# Patient Record
Sex: Female | Born: 1965 | Hispanic: Yes | Marital: Single | Smoking: Never smoker
Health system: Southern US, Community
[De-identification: ages and names within clinical notes are randomized; demographics above are authoritative.]

---

## 2004-03-16 ENCOUNTER — Emergency Department: Payer: Self-pay | Admitting: Emergency Medicine

## 2004-11-04 ENCOUNTER — Emergency Department: Payer: Self-pay | Admitting: General Practice

## 2004-11-05 ENCOUNTER — Ambulatory Visit: Payer: Self-pay | Admitting: Emergency Medicine

## 2005-02-27 ENCOUNTER — Emergency Department: Payer: Self-pay | Admitting: Emergency Medicine

## 2011-04-03 ENCOUNTER — Ambulatory Visit: Payer: Self-pay

## 2014-09-21 ENCOUNTER — Other Ambulatory Visit: Payer: Self-pay | Admitting: Oncology

## 2014-09-21 ENCOUNTER — Ambulatory Visit: Payer: Self-pay | Attending: Oncology | Admitting: *Deleted

## 2014-09-21 ENCOUNTER — Encounter: Payer: Self-pay | Admitting: *Deleted

## 2014-09-21 ENCOUNTER — Ambulatory Visit
Admission: RE | Admit: 2014-09-21 | Discharge: 2014-09-21 | Disposition: A | Discharge status: Home / Self Care | Payer: Self-pay | Source: Ambulatory Visit | Attending: Oncology | Admitting: Oncology

## 2014-09-21 VITALS — BP 107/70 | HR 80 | Temp 97.9°F | Resp 16 | Ht 59.06 in | Wt 215.7 lb

## 2014-09-21 DIAGNOSIS — Z0000 Encounter for general adult medical examination without abnormal findings: Secondary | ICD-10-CM

## 2014-09-21 NOTE — Progress Notes (Signed)
Subjective:     Patient ID: Kathy Washington, female   DOB: September 24, 1965, 49 y.o.   MRN: [id number]  HPI   Review of Systems     Objective:   Physical Exam  Pulmonary/Chest: Right breast exhibits no inverted nipple, no mass, no nipple discharge, no skin change and no tenderness. Left breast exhibits no inverted nipple, no mass, no nipple discharge, no skin change and no tenderness. Breasts are symmetrical.  Genitourinary: Uterus is not deviated, not enlarged, not fixed and not tender. Cervix exhibits no discharge and no friability. Right adnexum displays no mass, no tenderness and no fullness. Left adnexum displays no mass, no tenderness and no fullness.         Assessment:     49 year old Hispanic female presents to [facility] for annual screening.  Maritza, the interpreter present during the interview and exam.  Clinical breast exam unremarkable.  Taught self breast awareness.  Specimen collected for pap smear. Patient has been screened for eligibility.  She does not have any insurance, Medicare or Medicaid.  She also meets financial eligibility.  Hand-out given on the Affordable Care Act.    Plan:  Screening mammogram ordered.  Follow-up per [facility] protocol.

## 2014-09-23 LAB — PAP LB AND HPV HIGH-RISK
HPV, HIGH-RISK: NEGATIVE
PAP Smear Comment: 0

## 2014-09-27 ENCOUNTER — Encounter: Payer: Self-pay | Admitting: *Deleted

## 2014-09-27 NOTE — Progress Notes (Signed)
Letter mailed to inform patient of her normal mammogram and pap smear.  Follow up with screening mammogram in one year and next pap in 5 years.  HSIS to Christy. 

## 2015-12-20 ENCOUNTER — Encounter (INDEPENDENT_AMBULATORY_CARE_PROVIDER_SITE_OTHER): Payer: Self-pay

## 2015-12-20 ENCOUNTER — Ambulatory Visit
Admission: RE | Admit: 2015-12-20 | Discharge: 2015-12-20 | Disposition: A | Discharge status: Home / Self Care | Payer: Self-pay | Source: Ambulatory Visit | Attending: Oncology | Admitting: Oncology

## 2015-12-20 ENCOUNTER — Ambulatory Visit: Payer: Self-pay | Attending: Oncology | Admitting: *Deleted

## 2015-12-20 ENCOUNTER — Encounter: Payer: Self-pay | Admitting: *Deleted

## 2015-12-20 VITALS — BP 100/61 | HR 67 | Temp 98.5°F

## 2015-12-20 DIAGNOSIS — Z0000 Encounter for general adult medical examination without abnormal findings: Secondary | ICD-10-CM

## 2015-12-20 NOTE — Patient Instructions (Signed)
PATIENT INSTRUCTIONS FIBROCYSTIC BREAST DISEASE  FOLLOW-UP:  Call your physician should you develop a new breast mass that is different, if one particular lump starts to enlarge, or if nipple discharge develops.   CAUSE:  Many women have some lumpiness within their breasts and these areas at times can become tender during certain times in your menstrual cycle.  These areas tend to feel like a firm rubber ball as compared to a cancer which will more commonly feel hard and almost rock-like.  Fibrocystic breast disease does not in and of itself increase your risk for breast cancer but you should be sure to examine yiour breasts at the same time of the month on a monthly basis.  If there are a lot of areas of lumpiness you should tape a piece of paper on the mirror with a diagram of your breasts, noting where the areas of lumpiness are and their relative size.  You can then refer to this diagram on a monthly basis to keep better track of any changes should they occur.  DIET:  You should try and avoid foods, or at least minimize foods, such as chocolate and caffeine which may cause the symptoms of tenderness to become worse.  ACTIVITY:  You may want to wear a bra that offers additional support, and/or consider a sports bra, especially during those times when your breasts are more tender.  MEDICATIONS:  Taking Vitamin E capsules twice a day along with Evening of Primrose Capsules three times a day, or as directed on the bottle, may help your symptoms.  These are both available over-the-counter and without a prescription. There is clinical evidence that these may help symptoms in some patients.   If your physician has prescribed medication for your fibrocystic breast disease, be sure to take it as instructed on the bottle and let him/her know if you have any side effects.  QUESTIONS:  Please feel free to call your physician  if you have any questions, and they will be glad to assist you.   Gave patient  hand-out, Women Staying Healthy, Active and Well from [facility], with education on breast health, pap smears, heart and colon health.   

## 2015-12-20 NOTE — Progress Notes (Signed)
Subjective:     Patient ID: Kathy Washington, female   DOB: [birth year], 50 y.o.   MRN: [id number]  HPI   Review of Systems     Objective:   Physical Exam  Pulmonary/Chest: Right breast exhibits tenderness. Right breast exhibits no inverted nipple, no mass, no nipple discharge and no skin change. Left breast exhibits tenderness. Left breast exhibits no inverted nipple, no mass, no nipple discharge and no skin change. Breasts are symmetrical.         Assessment:     50 [facility] Hispanic [facility]  She was also referred by [facility] for right breast pain.  Lloyda, the interpreter present during the interview and exam.  Patient states she has been having intermittent bilateral breast pain in the upper outer quadrants prior to her menstrual cycle for the past 2 years.  States greater pain on the right.  States the pain is only present when she touches it.  States she drinks about 4 cups of coffee, plus 2 or 3 soda's a day.  On clinical breast exam there is tender grainy fibroglandular like tissue in bilateral upper outer quadrants.  There is no dominant mass, skin changes, nipple discharge or lymphadenopathy.  Taught self breast awareness. Patient has been screened for eligibility.  She does not have any insurance, Medicare or Medicaid.  She also meets financial eligibility.  Hand-out given on the Affordable Care Act.    Plan:     Screening mammogram ordered.  She is to decrease caffeine intake, wear a good support bra, and try OTC vitamin E or Evening Primrose Oil if pain persist.

## 2015-12-21 ENCOUNTER — Encounter: Payer: Self-pay | Admitting: *Deleted

## 2015-12-21 NOTE — Progress Notes (Signed)
Letter mailed to inform patient of her normal mammogram.  She is to return in one year with annual screening.

## 2017-03-15 IMAGING — MG MM DIGITAL SCREENING BILATERAL
6 series · 6 of 6 positions shown · non-contrast
Comparison: Previous exam(s).

CLINICAL DATA: Screening.

EXAM:
DIGITAL SCREENING BILATERAL MAMMOGRAM WITH CAD

[R MLO (1 of 2)]
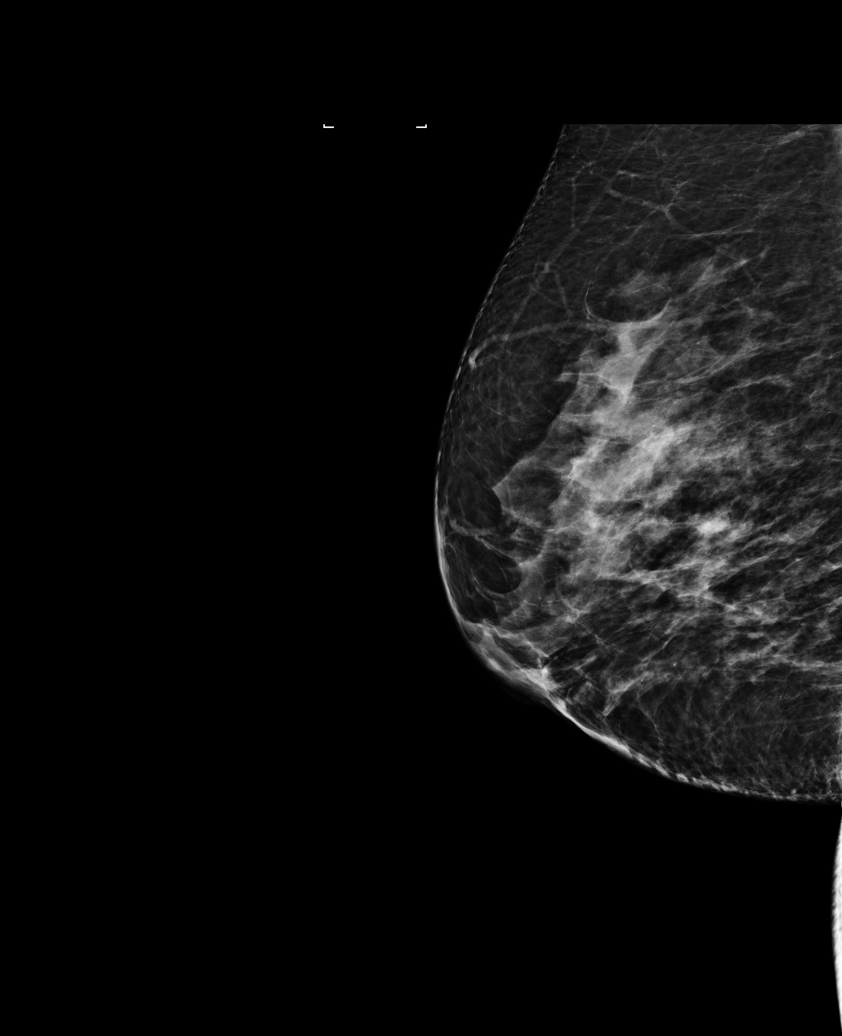

[L MLO (1 of 2)]
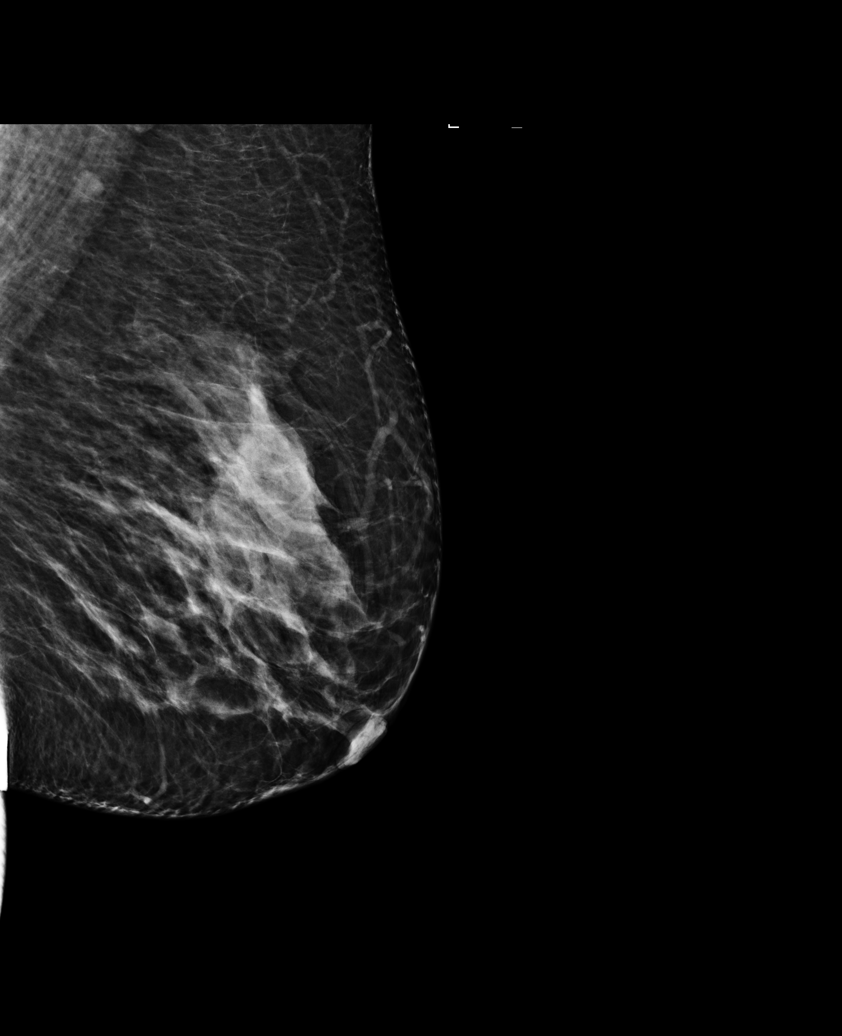

[R CC]
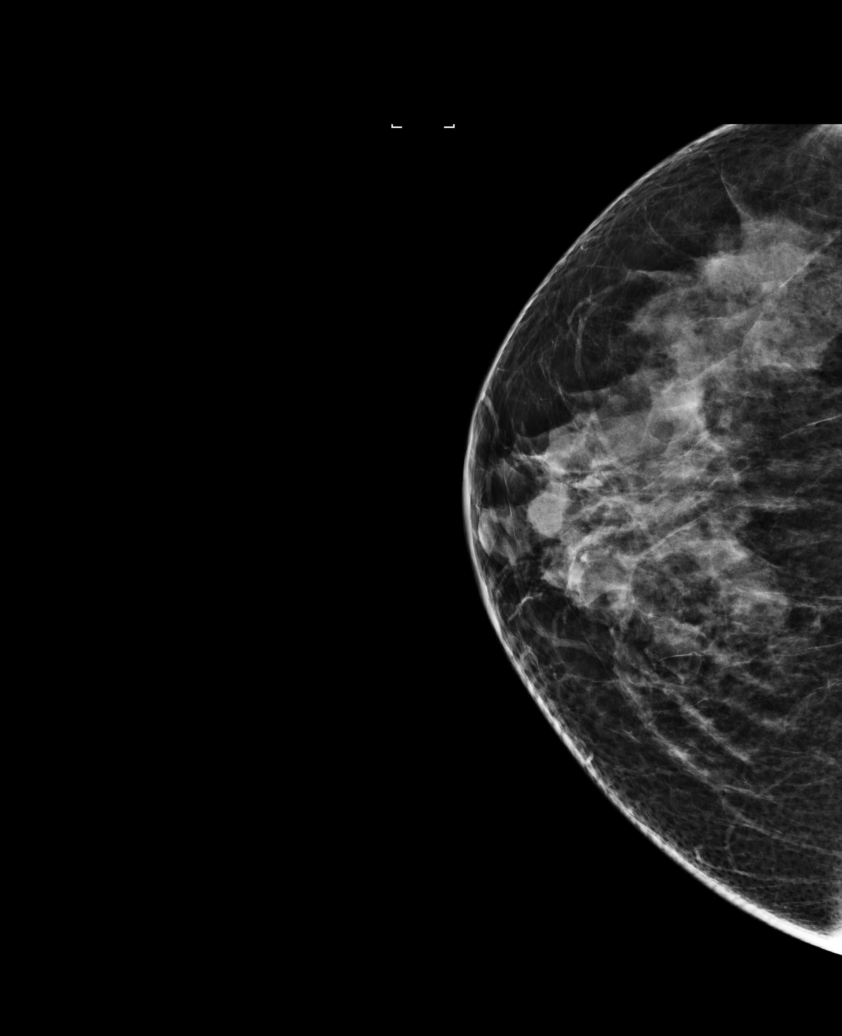

[L MLO (2 of 2)]
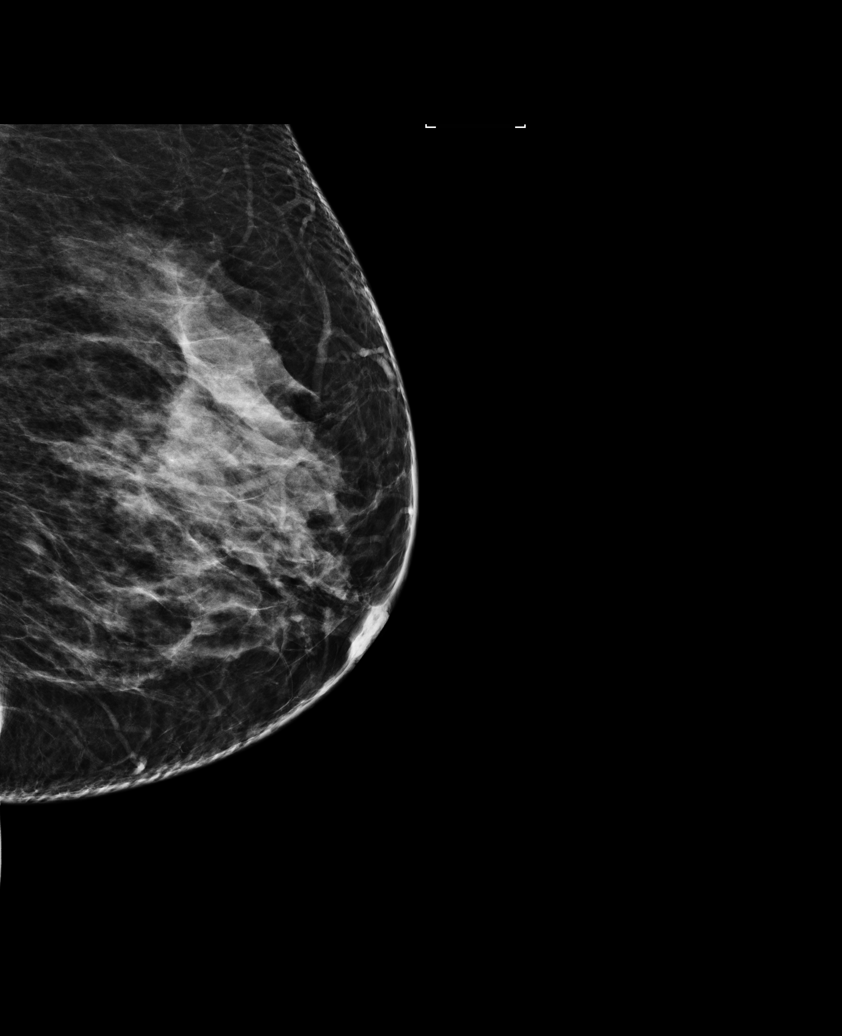

[L CC]
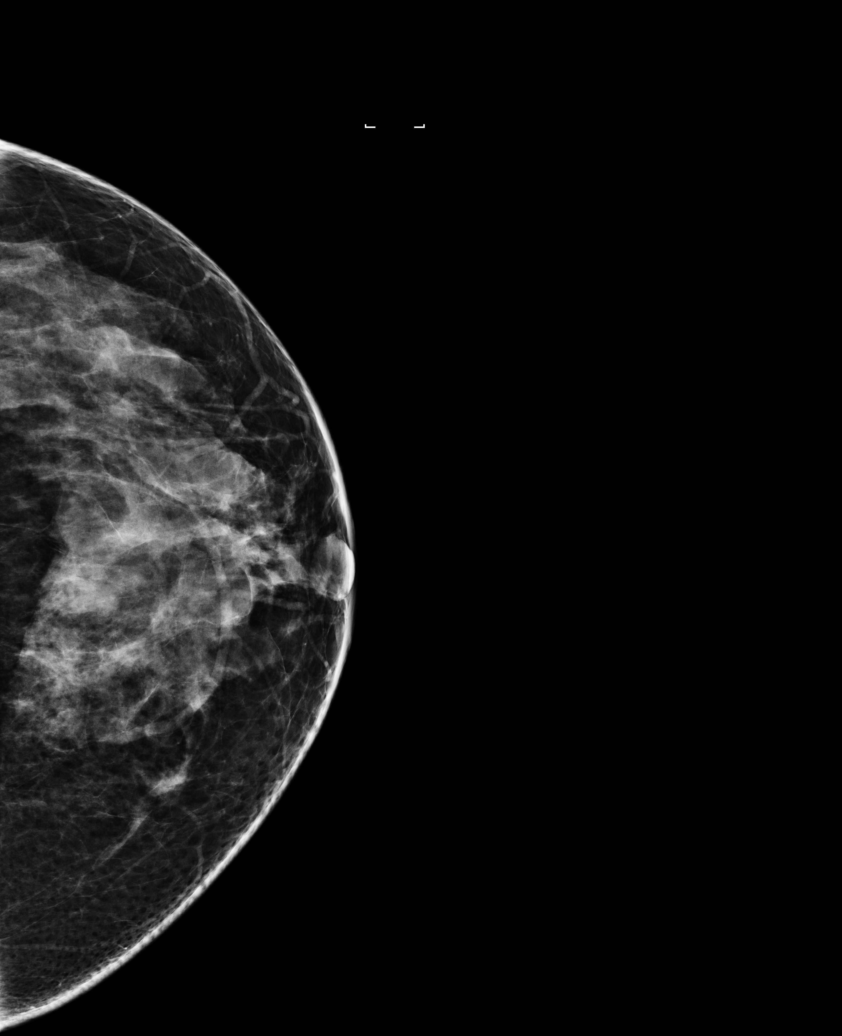

[R MLO (2 of 2)]
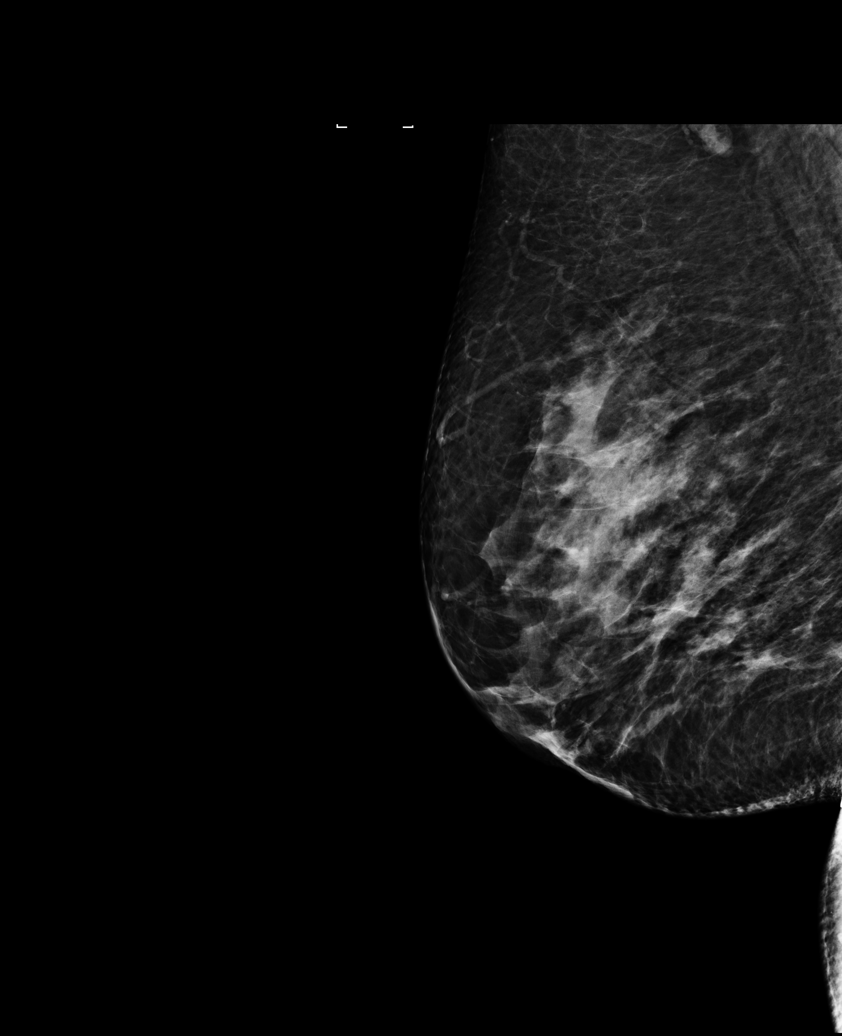

[6 of 6 positions shown; findings below may reference images not displayed]

ACR Breast Density Category d: The breast tissue is extremely dense,
which lowers the sensitivity of mammography.
FINDINGS: There are no findings suspicious for malignancy. Images were
processed with CAD.
IMPRESSION: No mammographic evidence of malignancy. A result letter of this
screening mammogram will be mailed directly to the patient.

RECOMMENDATION:
Screening mammogram in one year. (Code:[id number])

BI-RADS CATEGORY  1: Negative.

## 2018-01-21 ENCOUNTER — Ambulatory Visit: Payer: Self-pay | Attending: Oncology

## 2018-01-30 ENCOUNTER — Encounter: Payer: Self-pay | Admitting: Physician Assistant

## 2019-05-21 ENCOUNTER — Ambulatory Visit: Payer: Self-pay

## 2019-05-21 ENCOUNTER — Ambulatory Visit: Payer: Self-pay | Attending: Internal Medicine

## 2019-05-21 DIAGNOSIS — Z23 Encounter for immunization: Secondary | ICD-10-CM

## 2019-05-21 NOTE — Progress Notes (Signed)
   Covid-19 Vaccination Clinic  Name:  Kathy Washington    MRN: [id number] DOB: 18-Apr-1965  05/21/2019  Kathy Washington was observed post Covid-19 immunization for 15 minutes without incident. She was provided with Vaccine Information Sheet and instruction to access the V-Safe system.   Kathy Washington was instructed to call 911 with any severe reactions post vaccine: Marland Kitchen Difficulty breathing  . Swelling of face and throat  . A fast heartbeat  . A bad rash all over body  . Dizziness and weakness   Immunizations Administered    Name Date Dose VIS Date Route   Pfizer COVID-19 Vaccine 05/21/2019  5:44 PM 0.3 mL 02/05/2019 Intramuscular   Manufacturer: [employer], [employer]   Lot: [id number]   NDC: [address]-6

## 2019-06-11 ENCOUNTER — Ambulatory Visit: Payer: Self-pay

## 2019-06-12 ENCOUNTER — Ambulatory Visit: Payer: Self-pay | Attending: Internal Medicine

## 2019-06-12 DIAGNOSIS — Z23 Encounter for immunization: Secondary | ICD-10-CM

## 2019-06-12 NOTE — Progress Notes (Signed)
   Covid-19 Vaccination Clinic  Name:  Kathy Washington    MRN: [id number] DOB: August 06, 1965  06/12/2019  [name] was observed post Covid-19 immunization for 30 minutes based on pre-vaccination screening without incident. She was provided with Vaccine Information Sheet and instruction to access the V-Safe system.   [name] was instructed to call 911 with any severe reactions post vaccine: Marland Kitchen Difficulty breathing  . Swelling of face and throat  . A fast heartbeat  . A bad rash all over body  . Dizziness and weakness   Immunizations Administered    Name Date Dose VIS Date Route   Pfizer COVID-19 Vaccine 06/12/2019 10:36 AM 0.3 mL 02/05/2019 Intramuscular   Manufacturer: [employer], [employer]   Lot: [id number]   NDC: [address]-5

## 2019-12-31 DIAGNOSIS — D171 Benign lipomatous neoplasm of skin and subcutaneous tissue of trunk: Secondary | ICD-10-CM | POA: Insufficient documentation

## 2021-05-08 DIAGNOSIS — D509 Iron deficiency anemia, unspecified: Secondary | ICD-10-CM | POA: Insufficient documentation

## 2021-07-15 DIAGNOSIS — N939 Abnormal uterine and vaginal bleeding, unspecified: Secondary | ICD-10-CM | POA: Insufficient documentation

## 2023-03-27 ENCOUNTER — Encounter: Payer: Self-pay | Admitting: Family Medicine

## 2023-04-16 ENCOUNTER — Other Ambulatory Visit: Payer: Self-pay

## 2023-04-16 DIAGNOSIS — Z1231 Encounter for screening mammogram for malignant neoplasm of breast: Secondary | ICD-10-CM

## 2023-05-05 ENCOUNTER — Ambulatory Visit: Payer: Self-pay

## 2023-05-12 ENCOUNTER — Ambulatory Visit: Payer: Self-pay | Attending: Hematology and Oncology | Admitting: Hematology and Oncology

## 2023-05-12 ENCOUNTER — Ambulatory Visit
Admission: RE | Admit: 2023-05-12 | Discharge: 2023-05-12 | Disposition: A | Discharge status: Home / Self Care | Payer: Self-pay | Source: Ambulatory Visit | Attending: Obstetrics and Gynecology | Admitting: Obstetrics and Gynecology

## 2023-05-12 ENCOUNTER — Other Ambulatory Visit: Payer: Self-pay | Admitting: Hematology and Oncology

## 2023-05-12 VITALS — BP 121/77 | Wt 241.7 lb

## 2023-05-12 DIAGNOSIS — Z1231 Encounter for screening mammogram for malignant neoplasm of breast: Secondary | ICD-10-CM

## 2023-05-12 DIAGNOSIS — N6001 Solitary cyst of right breast: Secondary | ICD-10-CM

## 2023-05-12 DIAGNOSIS — Z803 Family history of malignant neoplasm of breast: Secondary | ICD-10-CM

## 2023-05-12 NOTE — Patient Instructions (Signed)
 Taught Kathy Washington about self breast awareness and gave educational materials to take home. Patient did not need a Pap smear today due to last Pap smear was in 09/14/2021 per patient. Let her know [facility] will cover Pap smears every 5 years unless has a history of abnormal Pap smears. Referred patient to the [facility] for screening mammogram. Appointment scheduled for 05/12/2023. Patient aware of appointment and will be there. Let patient know will follow up with her within the next couple weeks with results. Kathy Washington verbalized understanding.  Pascal Lux, NP 1:38 PM

## 2023-05-12 NOTE — Progress Notes (Unsigned)
 [name] is a 58 y.o. female who presents to [facility] clinic today with no complaints.    Pap Smear: Pap not smear completed today. Last Pap smear was 09/14/2021 at [facility] clinic and was normal. Per patient has no history of an abnormal Pap smear. Last Pap smear result is available in Epic.   Physical exam: Breasts Breasts symmetrical. No skin abnormalities bilateral breasts. No nipple retraction bilateral breasts. No nipple discharge bilateral breasts. No lymphadenopathy. No lumps palpated bilateral breasts.       Pelvic/Bimanual Pap is not indicated today    Smoking History: Patient has never smoked and was not referred to quit line.    Patient Navigation: Patient education provided. Access to services provided for patient through [facility] program. Kathy Washington interpreter provided. No transportation provided   Colorectal Cancer Screening: Per patient has had colonoscopy completed on 01/16/2022 with benign results.   No complaints today.    Breast and Cervical Cancer Risk Assessment: Patient has family history of breast cancer, with her twin sister who passed away from the disease. Patient does not have history of cervical dysplasia, immunocompromised, or DES exposure in-utero.  Risk Scores as of Encounter on 05/12/2023     Kathy Washington           5-year 2.26%   Lifetime 15.13%   This patient is Hispana/Latina but has no documented birth country, so the [place] model used data from [place] patients to calculate their risk score. Document a birth country in the Demographics activity for a more accurate score.         Last calculated by Narda Rutherford, LPN on 1/61/0960 at  1:20 PM      ;   A: [facility] exam without pap smear No complaints with benign exam.   P: Referred patient to the [facility] for a screening mammogram. Appointment scheduled 05/12/2023.  Ilda Basset A, NP 05/12/2023 1:16 PM

## 2023-07-09 ENCOUNTER — Encounter: Payer: Self-pay | Admitting: Licensed Clinical Social Worker

## 2023-07-09 ENCOUNTER — Inpatient Hospital Stay: Payer: Self-pay | Attending: Hematology and Oncology | Admitting: Licensed Clinical Social Worker

## 2023-07-09 ENCOUNTER — Inpatient Hospital Stay: Payer: Self-pay

## 2023-07-09 ENCOUNTER — Other Ambulatory Visit: Payer: Self-pay | Admitting: Licensed Clinical Social Worker

## 2023-07-09 DIAGNOSIS — Z800 Family history of malignant neoplasm of digestive organs: Secondary | ICD-10-CM

## 2023-07-09 DIAGNOSIS — Z803 Family history of malignant neoplasm of breast: Secondary | ICD-10-CM

## 2023-07-09 LAB — GENETIC SCREENING ORDER

## 2023-07-09 NOTE — Progress Notes (Signed)
 REFERRING PROVIDER: Adelaide Adjutant, NP [address] [place],  [place] [address]  PRIMARY PROVIDER:  Dionicia Frater, MD  PRIMARY REASON FOR VISIT:  1. Family history of breast cancer      HISTORY OF PRESENT ILLNESS:   [name], a 58 y.o. female, was seen for a [place] cancer genetics consultation at the request of Baldomero Bone, NP due to a family history of breast cancer.  [name] presents to clinic today to discuss the possibility of a hereditary predisposition to cancer, genetic testing, and to further clarify her future cancer risks, as well as potential cancer risks for family members.   CANCER HISTORY:  [name] is a 58 y.o. female with no personal history of cancer.    RISK FACTORS:  Menarche was at age 56.  First live birth at age 58.  Ovaries intact: yes.  Hysterectomy: no.  Colonoscopy: yes; normal. Mammogram within the last year: yes. Up to date with pelvic exams: yes.   FAMILY HISTORY:  We obtained a detailed, 4-generation family history.  Significant diagnoses are listed below: Family History  Problem Relation Age of Onset   Leukemia Mother 23   Lung cancer Father 3   Breast cancer Sister 75   Cancer Paternal Grandmother        unknown   Stomach cancer Paternal Grandfather        d.>50   Leukemia Son 47     [name] has 4 sons and 1 daughter, ages 55-38. Her oldest son was diagnosed with leukemia and lives in [place]. Patient had 1 twin sister, 3 other sisters and 1 brother. Her twin sister was diagnosed with breast cancer at 67 and passed at 42, she did not have genetic testing. Patient's nephew's son had unknown type of cancer and passed at 35. A niece's daughter passed of thyroid cancer at 88.  Ms. Hendriks mother died at 37 of leukemia. No other known cancers on this side of the family.  [name]'s father died at 58 of lung cancer. Patient's paternal grandfather had stomach  cancer and died over 67. Grandmother had metastatic cancer, unknown type and died over 36.   [name] is unaware of previous family history of genetic testing for hereditary cancer risks. There is no reported Ashkenazi Jewish ancestry. There is no known consanguinity.    GENETIC COUNSELING ASSESSMENT: [name] is a 58 y.o. female with a family history of breast cancer which is somewhat suggestive of a hereditary cancer syndrome and predisposition to cancer. We, therefore, discussed and recommended the following at today's visit.   DISCUSSION: We discussed that approximately 10% of breast cancer is hereditary. Most cases of hereditary breast cancer are associated with BRCA1/2 genes, although there are other genes associated with hereditary cancer as well. Cancers and risks are gene specific. We discussed that testing is beneficial for several reasons including knowing about cancer risks, identifying potential screening and risk-reduction options that may be appropriate, and to understand if other family members could be at risk for cancer and allow them to undergo genetic testing.   We reviewed the characteristics, features and inheritance patterns of hereditary cancer syndromes. We also discussed genetic testing, including the appropriate family members to test, the process of testing, insurance coverage and turn-around-time for results. We discussed the implications of a negative, positive and/or variant of uncertain significant result. We recommended [name] pursue genetic testing for the [facility] CancerNext+RNA gene panel.   The [facility] CancerNext+RNAinsight Panel includes  sequencing, rearrangement analysis, and RNA analysis for the following 40 genes: APC, ATM, BAP1, BARD1, BMPR1A, BRCA1, BRCA2, BRIP1, CDH1, CDKN2A, CHEK2, FH, FLCN, MET, MLH1, MSH2, MSH6, MUTYH, NF1, NTHL1, PALB2, PMS2, PTEN, RAD51C, RAD51D, RPS20, SMAD4, STK11, TP53, TSC1, TSC2, and VHL (sequencing and  deletion/duplication); AXIN2, HOXB13, MBD4, MSH3, POLD1 and POLE (sequencing only); EPCAM and GREM1 (deletion/duplication only).  Based on [name]'s family history of cancer, she meets medical criteria for genetic testing. Though [name] is not personally affected, there are no affected family members that are willing/able to undergo hereditary cancer testing.  Therefore, Ms. Mangieri the most informative family member available. Despite that she meets criteria, she may still have an out of pocket cost.   PLAN: After considering the risks, benefits, and limitations, [name] provided informed consent to pursue genetic testing and the blood sample was sent to [facility] for analysis of the CancerNext+RNA panel. Results should be available within approximately 2-3 weeks' time, at which point they will be disclosed by telephone to [name], as will any additional recommendations warranted by these results. [name] will receive a summary of her genetic counseling visit and a copy of her results once available. This information will also be available in Epic.   [name]'s questions were answered to her satisfaction today. Our contact information was provided should additional questions or concerns arise. Thank you for the referral and allowing us  to share in the care of your patient.   Valri Gee, MS, [facility] Genetic Counselor [email or web address] Phone: [phone]  45 minutes were spent on the date of the encounter in service to the patient including preparation, face-to-face consultation, documentation and care coordination. Dr. Nelson Bandy was available for discussion regarding this case.   _______________________________________________________________________ For Office Staff:  Number of people involved in session: 1 Was an Intern/ student involved with case: no

## 2023-07-24 ENCOUNTER — Encounter: Payer: Self-pay | Admitting: Licensed Clinical Social Worker

## 2023-07-24 ENCOUNTER — Ambulatory Visit: Payer: Self-pay | Admitting: Licensed Clinical Social Worker

## 2023-07-24 ENCOUNTER — Telehealth: Payer: Self-pay | Admitting: Licensed Clinical Social Worker

## 2023-07-24 DIAGNOSIS — Z1379 Encounter for other screening for genetic and chromosomal anomalies: Secondary | ICD-10-CM

## 2023-07-24 NOTE — Progress Notes (Signed)
 HPI:   Kathy Washington was previously seen in the [facility] due to a family history of breast cancer and concerns regarding a hereditary predisposition to cancer. Please refer to our prior cancer genetics clinic note for more information regarding our discussion, assessment and recommendations, at the time. Kathy Washington recent genetic test results were disclosed to her, as were recommendations warranted by these results. These results and recommendations are discussed in more detail below.  CANCER HISTORY:  Oncology History   No history exists.    FAMILY HISTORY:  We obtained a detailed, 4-generation family history.  Significant diagnoses are listed below: Family History  Problem Relation Age of Onset   Leukemia Mother 52   Lung cancer Father 47   Breast cancer Sister 61   Cancer Paternal Grandmother        unknown   Stomach cancer Paternal Grandfather        d.>50   Leukemia Son 71    Kathy Washington has 4 sons and 1 daughter, ages 10-38. Her oldest son was diagnosed with leukemia and lives in [place]. Patient had 1 twin sister, 3 other sisters and 1 brother. Her twin sister was diagnosed with breast cancer at 41 and passed at 19, she did not have genetic testing. Patient's nephew's son had unknown type of cancer and passed at 60. A niece's daughter passed of thyroid cancer at 69.   Kathy Washington mother died at 41 of leukemia. No other known cancers on this side of the family.   Kathy Washington's father died at 24 of lung cancer. Patient's paternal grandfather had stomach cancer and died over 71. Grandmother had metastatic cancer, unknown type and died over 100.    Kathy Washington is unaware of previous family history of genetic testing for hereditary cancer risks. There is no reported Ashkenazi Jewish ancestry. There is no known consanguinity.     GENETIC TEST RESULTS:  The [facility] CancerNext+RNA  Panel found no pathogenic  mutations.  The [facility] CancerNext+RNAinsight Panel includes sequencing, rearrangement analysis, and RNA analysis for the following 40 genes: APC, ATM, BAP1, BARD1, BMPR1A, BRCA1, BRCA2, BRIP1, CDH1, CDKN2A, CHEK2, FH, FLCN, MET, MLH1, MSH2, MSH6, MUTYH, NF1, NTHL1, PALB2, PMS2, PTEN, RAD51C, RAD51D, RPS20, SMAD4, STK11, TP53, TSC1, TSC2, and VHL (sequencing and deletion/duplication); AXIN2, HOXB13, MBD4, MSH3, POLD1 and POLE (sequencing only); EPCAM and GREM1 (deletion/duplication only).   The test report has been scanned into EPIC and is located under the Molecular Pathology section of the Results Review tab.  A portion of the result report is included below for reference. Genetic testing reported out on 07/23/2023.      Even though a pathogenic variant was not identified, possible explanations for the cancer in the family may include: There may be no hereditary risk for cancer in the family. The cancers in Kathy Washington and/or her family may be sporadic/familial or due to other genetic and environmental factors. There may be a gene mutation in one of these genes that current testing methods cannot detect but that chance is small. There could be another gene that has not yet been discovered, or that we have not yet tested, that is responsible for the cancer diagnoses in the family.  It is also possible there is a hereditary cause for the cancer in the family that Kathy Washington did not inherit.  Therefore, it is important to remain in touch with cancer genetics in the future so that we can continue to offer Kathy Washington the most  up to date genetic testing.   ADDITIONAL GENETIC TESTING:  We discussed with Kathy Washington that her genetic testing was fairly extensive.  If there are additional relevant genes identified to increase cancer risk that can be analyzed in the future, we would be happy to discuss and coordinate this testing at that time.     CANCER SCREENING  RECOMMENDATIONS:  Kathy Washington's test result is considered negative (normal).  This means that we have not identified a hereditary cause for her family history of cancer at this time.   An individual's cancer risk and medical management are not determined by genetic test results alone. Overall cancer risk assessment incorporates additional factors, including personal medical history, family history, and any available genetic information that may result in a personalized plan for cancer prevention and surveillance. Therefore, it is recommended she continue to follow the cancer management and screening guidelines provided by her primary healthcare provider.  Based on the reported personal and family history, specific cancer screenings for Kathy Washington and her family include:  Breast Cancer Screening:  The Tyrer-Cuzick model is one of multiple prediction models developed to estimate an individual's lifetime risk of developing breast cancer. The Tyrer-Cuzick model is endorsed by the [employer] (NCCN). This model includes many risk factors such as family history, endogenous estrogen exposure, and benign breast disease. The calculation is highly-dependent on the accuracy of clinical data provided by the patient and can change over time. The Tyrer-Cuzick model may be repeated to reflect new information in her personal or family history in the future.    Kathy Washington's Tyrer-Cuzick risk score is 21%.  For women with a greater than 20% lifetime risk of breast cancer, the NCCN recommends the following:    1.   Clinical encounter every 6-12 months to begin when identified as being at increased risk, but not before age 42    2.   Annual mammograms, tomosynthesis is recommended starting 10 years earlier than the youngest breast cancer diagnosis in the family or at age 39 (whichever comes first), but not before age 43     6.   Annual breast MRI  starting 10 years earlier than the youngest breast cancer diagnosis in the family or at age 63 (whichever comes first), but not before age 55    We have placed a referral for [facility].     RECOMMENDATIONS FOR FAMILY MEMBERS:   Since she did not inherit a identifiable mutation in a cancer predisposition gene included on this panel, her children could not have inherited a known mutation from her in one of these genes. Individuals in this family might be at some increased risk of developing cancer, over the general population risk, due to the family history of cancer.  Individuals in the family should notify their providers of the family history of cancer. We recommend women in this family have a yearly mammogram beginning at age 28, or 42 years younger than the earliest onset of cancer, an annual clinical breast exam, and perform monthly breast self-exams.  Family members should have colonoscopies by at age 13, or earlier, as recommended by their providers. Other members of the family may still carry a pathogenic variant in one of these genes that Kathy Washington did not inherit. Based on the family history, we recommend those related to her sister who had breast cancer at 48 have genetic counseling and testing. Kathy Washington will let us  know if we can  be of any assistance in coordinating genetic counseling and/or testing for this family member.    FOLLOW-UP:  Lastly, we discussed with Kathy Washington that cancer genetics is a rapidly advancing field and it is possible that new genetic tests will be appropriate for her and/or her family members in the future. We encouraged her to remain in contact with cancer genetics on an annual basis so we can update her personal and family histories and let her know of advances in cancer genetics that may benefit this family.   Our contact number was provided. Kathy Washington questions were answered to her satisfaction, and  she knows she is welcome to call us  at anytime with additional questions or concerns.    Valri Gee, MS, [facility] Genetic Counselor [email or web address] Phone: [phone]

## 2023-07-24 NOTE — Telephone Encounter (Signed)
 I contacted [name] to discuss her genetic testing results. No pathogenic variants were identified in the 40 genes analyzed. Detailed clinic note to follow.   The test report has been scanned into EPIC and is located under the Molecular Pathology section of the Results Review tab.  A portion of the result report is included below for reference.      Valri Gee, MS, [facility] Genetic Counselor [email or web address] Phone: [phone]

## 2023-09-02 ENCOUNTER — Inpatient Hospital Stay: Payer: Self-pay

## 2023-09-02 ENCOUNTER — Inpatient Hospital Stay: Payer: Self-pay | Attending: Hematology and Oncology | Admitting: Oncology

## 2023-09-02 ENCOUNTER — Encounter: Payer: Self-pay | Admitting: Oncology

## 2023-09-02 VITALS — BP 125/75 | HR 84 | Temp 99.3°F | Resp 18 | Ht 59.06 in | Wt 251.4 lb

## 2023-09-02 DIAGNOSIS — Z803 Family history of malignant neoplasm of breast: Secondary | ICD-10-CM | POA: Insufficient documentation

## 2023-09-02 DIAGNOSIS — Z800 Family history of malignant neoplasm of digestive organs: Secondary | ICD-10-CM | POA: Insufficient documentation

## 2023-09-02 DIAGNOSIS — Z1379 Encounter for other screening for genetic and chromosomal anomalies: Secondary | ICD-10-CM

## 2023-09-02 DIAGNOSIS — Z806 Family history of leukemia: Secondary | ICD-10-CM | POA: Insufficient documentation

## 2023-09-02 DIAGNOSIS — Z801 Family history of malignant neoplasm of trachea, bronchus and lung: Secondary | ICD-10-CM | POA: Insufficient documentation

## 2023-09-02 DIAGNOSIS — Z9189 Other specified personal risk factors, not elsewhere classified: Secondary | ICD-10-CM | POA: Insufficient documentation

## 2023-09-02 MED ORDER — TAMOXIFEN CITRATE 20 MG PO TABS: 20.0000 mg | ORAL_TABLET | Freq: Every day | ORAL | 3 refills | Status: DC

## 2023-09-02 NOTE — Progress Notes (Signed)
 [facility]  Telephone:([phone] Fax:(336) [phone]  ID: Yumiko Alkins OB: Jan 20, 1966  MR#: [id number]  RDW#:[id number]  Patient Care Team: Kandis Stefano Iles, MD as PCP - General (Family Medicine) Jacobo Evalene PARAS, MD as Consulting Physician (Oncology)  CHIEF COMPLAINT: High risk breast.  INTERVAL HISTORY: Patient is a 58 year old female who was recently evaluated by genetic counseling for significant family history of breast cancer.  She was found to have no high risk genetic abnormalities, but was noted to have a Tyrer-Cuzick score of 21.4%.  He currently feels well and is asymptomatic.  There is no neurologic complaints.  She denies any recent fevers or illnesses.  She has good appetite and denies weight loss.  She has no chest pain, shortness of breath, cough, or hemoptysis.  She denies any nausea, vomiting, constipation, or diarrhea.  She has no urinary complaints.  Patient offers no specific complaints today.  REVIEW OF SYSTEMS:   Review of Systems  Constitutional: Negative.  Negative for fever, malaise/fatigue and weight loss.  Respiratory: Negative.  Negative for cough, hemoptysis and shortness of breath.   Cardiovascular: Negative.  Negative for chest pain and leg swelling.  Gastrointestinal: Negative.  Negative for abdominal pain.  Genitourinary: Negative.  Negative for dysuria, hematuria and urgency.  Musculoskeletal: Negative.  Negative for back pain.  Skin: Negative.  Negative for rash.  Neurological: Negative.  Negative for dizziness, speech change, focal weakness and headaches.  Psychiatric/Behavioral: Negative.  The patient is not nervous/anxious.     As per HPI. Otherwise, a complete review of systems is negative.  PAST MEDICAL HISTORY: History reviewed. No pertinent past medical history.  PAST SURGICAL HISTORY: History reviewed. No pertinent surgical history.  FAMILY HISTORY: Family History  Problem Relation Age of  Onset   Leukemia Mother 25   Lung cancer Father 31   Breast cancer Sister 13   Cancer Paternal Grandmother        unknown   Stomach cancer Paternal Grandfather        d.>50   Leukemia Son 70    ADVANCED DIRECTIVES (Y/N):  N  HEALTH MAINTENANCE: Social History   Tobacco Use   Smoking status: Never   Smokeless tobacco: Never  Vaping Use   Vaping status: Never Used  Substance Use Topics   Alcohol use: Not Currently   Drug use: Never     Colonoscopy:  PAP:  Bone density:  Lipid panel:  No Known Allergies  Current Outpatient Medications  Medication Sig Dispense Refill   albuterol (VENTOLIN HFA) 108 (90 Base) MCG/ACT inhaler Inhale 1 puff into the lungs every 4 (four) hours as needed.     budesonide-formoterol (SYMBICORT) 160-4.5 MCG/ACT inhaler Inhale 2 puffs into the lungs daily.     omeprazole (PRILOSEC) 20 MG capsule Take 20 mg by mouth daily.     tamoxifen  (NOLVADEX ) 20 MG tablet Take 1 tablet (20 mg total) by mouth daily. 90 tablet 3   No current facility-administered medications for this visit.    OBJECTIVE: Vitals:   09/02/23 1335  BP: 125/75  Pulse: 84  Resp: 18  Temp: 99.3 F (37.4 C)  SpO2: 98%     Body mass index is 50.67 kg/m.    ECOG FS:0 - Asymptomatic  General: Well-developed, well-nourished, no acute distress. Eyes: Pink conjunctiva, anicteric sclera. HEENT: Normocephalic, moist mucous membranes. Lungs: No audible wheezing or coughing. Heart: Regular rate and rhythm. Abdomen: Soft, nontender, no obvious distention. Musculoskeletal: No edema, cyanosis, or clubbing. Neuro: Alert,  answering all questions appropriately. Cranial nerves grossly intact. Skin: No rashes or petechiae noted. Psych: Normal affect. Lymphatics: No cervical, calvicular, axillary or inguinal LAD.   LAB RESULTS:  No results found for: NA, K, CL, CO2, GLUCOSE, BUN, CREATININE, CALCIUM, PROT, ALBUMIN, AST, ALT, ALKPHOS, BILITOT, GFRNONAA,  GFRAA  No results found for: WBC, NEUTROABS, HGB, HCT, MCV, PLT   STUDIES: No results found.  ASSESSMENT: High risk breast.  PLAN:    High risk breast: Patient has no personal history of breast cancer and genetic testing was negative.  Tyrer-Cuzick score was greater than 20% at 21.4%.  After lengthy discussion, patient has agreed to initiate tamoxifen  prophylaxis 20 mg daily for a total of 5 years completing treatment in July 2030.  This prescription was called in.  She also wishes to pursue annual mammograms alternating with annual breast MRI.  I have ordered a breast MRI to be scheduled for September 2025 which is 6 months after her most recent mammogram which was reported as BI-RADS 1.  No further intervention is needed.  Return to clinic in 3 months for further evaluation by APP.  I spent a total of 60 minutes reviewing chart data, face-to-face evaluation with the patient, counseling and coordination of care as detailed above.   Patient expressed understanding and was in agreement with this plan. She also understands that She can call clinic at any time with any questions, concerns, or complaints.    Evalene JINNY Reusing, MD   09/02/2023 3:05 PM

## 2023-09-10 ENCOUNTER — Ambulatory Visit: Admission: RE | Admit: 2023-09-10 | Payer: Self-pay | Source: Ambulatory Visit

## 2023-12-03 ENCOUNTER — Ambulatory Visit: Payer: Self-pay | Admitting: Oncology

## 2023-12-04 ENCOUNTER — Inpatient Hospital Stay: Payer: Self-pay | Attending: Hematology and Oncology | Admitting: Nurse Practitioner

## 2023-12-09 ENCOUNTER — Ambulatory Visit
Admission: RE | Admit: 2023-12-09 | Discharge: 2023-12-09 | Disposition: A | Discharge status: Home / Self Care | Payer: Self-pay | Source: Ambulatory Visit | Attending: Oncology | Admitting: Oncology

## 2023-12-09 DIAGNOSIS — Z803 Family history of malignant neoplasm of breast: Secondary | ICD-10-CM | POA: Insufficient documentation

## 2023-12-09 DIAGNOSIS — Z1379 Encounter for other screening for genetic and chromosomal anomalies: Secondary | ICD-10-CM | POA: Insufficient documentation

## 2023-12-09 MED ORDER — GADOBUTROL 1 MMOL/ML IV SOLN
10.0000 mL | Freq: Once | INTRAVENOUS | Status: AC | PRN
  Administered 2023-12-09: 10 mL via INTRAVENOUS

## 2023-12-22 ENCOUNTER — Encounter: Payer: Self-pay | Admitting: Nurse Practitioner

## 2023-12-22 ENCOUNTER — Inpatient Hospital Stay: Payer: Self-pay | Attending: Nurse Practitioner | Admitting: Nurse Practitioner

## 2023-12-22 VITALS — BP 122/74 | HR 87 | Temp 98.3°F | Resp 18 | Ht 59.06 in | Wt 258.6 lb

## 2023-12-22 DIAGNOSIS — Z79810 Long term (current) use of selective estrogen receptor modulators (SERMs): Secondary | ICD-10-CM | POA: Insufficient documentation

## 2023-12-22 DIAGNOSIS — Z5181 Encounter for therapeutic drug level monitoring: Secondary | ICD-10-CM

## 2023-12-22 DIAGNOSIS — N939 Abnormal uterine and vaginal bleeding, unspecified: Secondary | ICD-10-CM | POA: Insufficient documentation

## 2023-12-22 DIAGNOSIS — Z803 Family history of malignant neoplasm of breast: Secondary | ICD-10-CM | POA: Insufficient documentation

## 2023-12-22 DIAGNOSIS — Z806 Family history of leukemia: Secondary | ICD-10-CM | POA: Insufficient documentation

## 2023-12-22 DIAGNOSIS — Z801 Family history of malignant neoplasm of trachea, bronchus and lung: Secondary | ICD-10-CM | POA: Insufficient documentation

## 2023-12-22 DIAGNOSIS — N938 Other specified abnormal uterine and vaginal bleeding: Secondary | ICD-10-CM

## 2023-12-22 DIAGNOSIS — Z9189 Other specified personal risk factors, not elsewhere classified: Secondary | ICD-10-CM | POA: Insufficient documentation

## 2023-12-22 DIAGNOSIS — Z800 Family history of malignant neoplasm of digestive organs: Secondary | ICD-10-CM | POA: Insufficient documentation

## 2023-12-22 DIAGNOSIS — E669 Obesity, unspecified: Secondary | ICD-10-CM | POA: Insufficient documentation

## 2023-12-22 MED ORDER — TAMOXIFEN CITRATE 20 MG PO TABS: 20.0000 mg | ORAL_TABLET | Freq: Every day | ORAL | 3 refills | Status: AC

## 2023-12-22 NOTE — Progress Notes (Signed)
 Breast MRI 12/09/23.  C/o not sleeping well, she is biting her tongue at bedtime. Having a lot of snoring. She feels like her food is getting stuck or there is a mass in her throat.  C/o back pain 10/10 at times. No injuries. Sees provider in [place].

## 2023-12-22 NOTE — Progress Notes (Signed)
 [facility]  Telephone:(336) [phone] Fax:(336) [phone]  ID: Kathy Washington OB: 21-Jul-1965  MR#: [id number]  RDW#:[id number]  Patient Care Team: Kandis Stefano Iles, MD as PCP - General (Family Medicine) Jacobo Evalene PARAS, MD as Consulting Physician (Oncology)  CHIEF COMPLAINT: High risk breast  INTERVAL HISTORY: Patient is a 58 year old female with high risk of developing breast cancer who returns to clinic for follow up. She was previously evaluated by genetic counseling d/t family history of breast cancer.  She was found to have no high risk genetic abnormalities but was noted to have a Tyrer-Cuzick score of 21.4%.  She has elected for prophylactic tamoxifen  which she continues and recently had an MRI.  She performs monthly self breast exams and denies any skin changes, masses.  Denies any unintentional weight loss or unusual bone pain.  She previously had abnormal uterine bleeding and an IUD was placed in 2023.  She has chronic discharge postplacement.  She has not seen gynecology recently.  Reports that her other cancer screenings are up-to-date.  REVIEW OF SYSTEMS:   Review of Systems  Constitutional: Negative.  Negative for fever, malaise/fatigue and weight loss.  Respiratory: Negative.  Negative for cough, hemoptysis and shortness of breath.   Cardiovascular: Negative.  Negative for chest pain and leg swelling.  Gastrointestinal:  Negative for abdominal pain, blood in stool, constipation, diarrhea, melena and nausea.  Genitourinary: Negative.  Negative for dysuria, hematuria and urgency.       Vaginal discharge- IUD in place  Musculoskeletal:  Negative for back pain, joint pain and myalgias.  Skin: Negative.  Negative for rash.  Neurological:  Negative for dizziness, speech change, focal weakness and headaches.  Psychiatric/Behavioral:  Negative for depression. The patient is not nervous/anxious.   As per HPI. Otherwise, a complete review of  systems is negative.  PAST MEDICAL HISTORY: History reviewed. No pertinent past medical history.  PAST SURGICAL HISTORY: History reviewed. No pertinent surgical history.  FAMILY HISTORY: Family History  Problem Relation Age of Onset   Leukemia Mother 70   Lung cancer Father 39   Breast cancer Sister 60   Cancer Paternal Grandmother        unknown   Stomach cancer Paternal Grandfather        d.>50   Leukemia Son 41   ADVANCED DIRECTIVES (Y/N):  N  HEALTH MAINTENANCE: Social History   Tobacco Use   Smoking status: Never   Smokeless tobacco: Never  Vaping Use   Vaping status: Never Used  Substance Use Topics   Alcohol use: Not Currently   Drug use: Never    Colonoscopy:  PAP:  Bone density:  Lipid panel:  No Known Allergies  Current Outpatient Medications  Medication Sig Dispense Refill   albuterol (VENTOLIN HFA) 108 (90 Base) MCG/ACT inhaler Inhale 1 puff into the lungs every 4 (four) hours as needed.     budesonide-formoterol (SYMBICORT) 160-4.5 MCG/ACT inhaler Inhale 2 puffs into the lungs daily.     omeprazole (PRILOSEC) 20 MG capsule Take 20 mg by mouth daily.     tamoxifen  (NOLVADEX ) 20 MG tablet Take 1 tablet (20 mg total) by mouth daily. (Patient not taking: Reported on 12/22/2023) 90 tablet 3   No current facility-administered medications for this visit.    OBJECTIVE: Vitals:   12/22/23 1527  BP: 122/74  Pulse: 87  Resp: 18  Temp: 98.3 F (36.8 C)  SpO2: 97%     Body mass index is 52.12 kg/m.  ECOG FS:0 - Asymptomatic  General: Well-developed, well-nourished, no acute distress. Eyes: Pink conjunctiva, anicteric sclera. Lungs: No audible wheezing or coughing Abdomen: obese Musculoskeletal: No edema, cyanosis, or clubbing. Neuro: Alert, answering all questions appropriately. Cranial nerves grossly intact. Skin: No rashes or petechiae noted. Psych: Normal affect. Breast: Deferred d/t recent mri  STUDIES: MR BREAST BILATERAL W WO CONTRAST  INC CAD Result Date: 12/09/2023 CLINICAL DATA:  Suboptimal high risk screening. Family history of breast carcinoma. EXAM: BILATERAL BREAST MRI WITH AND WITHOUT CONTRAST TECHNIQUE: Multiplanar, multisequence MR images of both breasts were obtained prior to and following the intravenous administration of 10 ml of Gadavist Three-dimensional MR images were rendered by post-processing of the original MR data on an independent workstation. The three-dimensional MR images were interpreted, and findings are reported in the following complete MRI report for this study. Three dimensional images were evaluated at the independent interpreting workstation using the DynaCAD thin client. COMPARISON:  Prior exams. FINDINGS: Breast composition: c. Heterogeneous fibroglandular tissue. Background parenchymal enhancement: Moderate Right breast: No mass or abnormal enhancement. Left breast: No mass or abnormal enhancement. Lymph nodes: No abnormal appearing lymph nodes. Ancillary findings:  None. IMPRESSION: 1. No MRI evidence of malignancy of either breast. RECOMMENDATION: 1. Annual screening mammography. 2. Based on the American Cancer Society guidelines, supplemental high risk annual screening breast MRI should be considered for patients with a greater than 20-25% lifetime risk for developing breast carcinoma. BI-RADS CATEGORY  1: Negative. Electronically Signed   By: Alm Parkins M.D.   On: 12/09/2023 14:14   ASSESSMENT: High risk breast  PLAN:    High risk breast: Patient has no personal history of breast cancer and genetic testing was negative.  Tyrer-Cuzick score was greater than 20% at 21.4%.  After lengthy discussion, patient has agreed to initiate tamoxifen  prophylaxis 20 mg daily for a total of 5 years completing treatment in July 2030. Tolerating well. Plan to continue annual mammograms alternating with annual breast MRI. Continue tamoxifen . Bilateral mammogram ordered for March 2025. Will plan to see her back in 6  months.  Vaginal Bleeding & Discharge- s/p endometrial biopsy and IUD/Liletta insertion in 2023 with [facility]/Dr Arora. Biopsy at that time was benign. Recommended annual pelvic exam with gynecology.  We reviewed that tamoxifen  can cause endometrial hyper proliferation.  With her underlying obesity she is at increased risk.  I recommend she follow-up with gynecology.  If increased bleeding recommend pelvic ultrasound. Cancer Screenings- she will continue to follow up with PCP for other cancer screenings and wellness.   Due to language barrier, a Spanish language interpreter participated in all patient interactions  Disposition:  March 2025- mammogram 6 mo- see Dr Jacobo or myself for results- la Interpreter for all appointments please  I spent a total of 35 minutes reviewing chart data, face-to-face evaluation with the patient, counseling and coordination of care as detailed above.  Patient expressed understanding and was in agreement with this plan. She also understands that She can call clinic at any time with any questions, concerns, or complaints.   Tinnie KANDICE Dawn, NP    12/22/2023

## 2023-12-22 NOTE — Addendum Note (Signed)
 Addended by: JOSHUA ALFONSO CROME on: 12/22/2023 04:27 PM   Modules accepted: Orders

## 2024-02-10 ENCOUNTER — Telehealth: Payer: Self-pay

## 2024-05-17 ENCOUNTER — Inpatient Hospital Stay: Payer: Self-pay | Admitting: Oncology
